# Patient Record
Sex: Male | Born: 1990 | Race: White | Hispanic: No | Marital: Single | Smoking: Current every day smoker
Health system: Southern US, Community
[De-identification: ages and names within clinical notes are randomized; demographics above are authoritative.]

## PROBLEM LIST (undated history)

## (undated) HISTORY — PX: ABDOMINAL SURGERY: SHX537

## (undated) HISTORY — PX: APPENDECTOMY: SHX54

---
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
## 2013-10-18 ENCOUNTER — Encounter (HOSPITAL_COMMUNITY): Payer: Self-pay | Admitting: Emergency Medicine

## 2013-10-18 ENCOUNTER — Emergency Department (HOSPITAL_COMMUNITY)
Admission: EM | Admit: 2013-10-18 | Discharge: 2013-10-18 | Disposition: A | Discharge status: Home / Self Care | Payer: Self-pay | Attending: Emergency Medicine | Admitting: Emergency Medicine

## 2013-10-18 ENCOUNTER — Emergency Department (HOSPITAL_COMMUNITY): Payer: Self-pay

## 2013-10-18 DIAGNOSIS — T8140XA Infection following a procedure, unspecified, initial encounter: Secondary | ICD-10-CM | POA: Insufficient documentation

## 2013-10-18 DIAGNOSIS — L089 Local infection of the skin and subcutaneous tissue, unspecified: Secondary | ICD-10-CM

## 2013-10-18 DIAGNOSIS — F17200 Nicotine dependence, unspecified, uncomplicated: Secondary | ICD-10-CM | POA: Insufficient documentation

## 2013-10-18 DIAGNOSIS — S6990XA Unspecified injury of unspecified wrist, hand and finger(s), initial encounter: Secondary | ICD-10-CM

## 2013-10-18 DIAGNOSIS — Y838 Other surgical procedures as the cause of abnormal reaction of the patient, or of later complication, without mention of misadventure at the time of the procedure: Secondary | ICD-10-CM | POA: Insufficient documentation

## 2013-10-18 DIAGNOSIS — S6980XA Other specified injuries of unspecified wrist, hand and finger(s), initial encounter: Secondary | ICD-10-CM

## 2013-10-18 DIAGNOSIS — IMO0002 Reserved for concepts with insufficient information to code with codable children: Secondary | ICD-10-CM

## 2013-10-18 LAB — CBC WITH DIFFERENTIAL/PLATELET
Basophils Absolute: 0 10*3/uL (ref 0.0–0.1)
Basophils Relative: 0 % (ref 0–1)
EOS PCT: 3 % (ref 0–5)
Eosinophils Absolute: 0.2 10*3/uL (ref 0.0–0.7)
HEMATOCRIT: 41.3 % (ref 39.0–52.0)
Hemoglobin: 14.2 g/dL (ref 13.0–17.0)
LYMPHS ABS: 2.7 10*3/uL (ref 0.7–4.0)
LYMPHS PCT: 32 % (ref 12–46)
MCH: 31.5 pg (ref 26.0–34.0)
MCHC: 34.4 g/dL (ref 30.0–36.0)
MCV: 91.6 fL (ref 78.0–100.0)
MONO ABS: 0.5 10*3/uL (ref 0.1–1.0)
Monocytes Relative: 6 % (ref 3–12)
Neutro Abs: 5 10*3/uL (ref 1.7–7.7)
Neutrophils Relative %: 59 % (ref 43–77)
Platelets: 297 10*3/uL (ref 150–400)
RBC: 4.51 MIL/uL (ref 4.22–5.81)
RDW: 12.8 % (ref 11.5–15.5)
WBC: 8.5 10*3/uL (ref 4.0–10.5)

## 2013-10-18 LAB — BASIC METABOLIC PANEL
BUN: 12 mg/dL (ref 6–23)
CO2: 24 mEq/L (ref 19–32)
CREATININE: 1.13 mg/dL (ref 0.50–1.35)
Calcium: 9.5 mg/dL (ref 8.4–10.5)
Chloride: 104 mEq/L (ref 96–112)
GFR calc Af Amer: >90 mL/min (ref >90)
GFR calc non Af Amer: >90 mL/min (ref >90)
Glucose, Bld: 95 mg/dL (ref 70–99)
Potassium: 3.9 mEq/L (ref 3.7–5.3)
Sodium: 142 mEq/L (ref 137–147)

## 2013-10-18 MED ORDER — OXYCODONE-ACETAMINOPHEN 5-325 MG PO TABS: 1.0000 | ORAL_TABLET | ORAL | Status: DC | PRN

## 2013-10-18 MED ORDER — HYDROMORPHONE HCL PF 1 MG/ML IJ SOLN
INTRAMUSCULAR | Status: AC
  Administered 2013-10-18: 1 mg via INTRAVENOUS
  Filled 2013-10-18: qty 1

## 2013-10-18 MED ORDER — LIDOCAINE HCL (PF) 1 % IJ SOLN
INTRAMUSCULAR | Status: AC
  Administered 2013-10-18: 13:00:00
  Filled 2013-10-18: qty 5

## 2013-10-18 MED ORDER — SODIUM CHLORIDE 0.9 % IV SOLN
3.0000 g | Freq: Once | INTRAVENOUS | Status: AC
  Administered 2013-10-18: 3 g via INTRAVENOUS
  Filled 2013-10-18: qty 3

## 2013-10-18 MED ORDER — IBUPROFEN 800 MG PO TABS
800.0000 mg | ORAL_TABLET | Freq: Once | ORAL | Status: AC
  Administered 2013-10-18: 800 mg via ORAL
  Filled 2013-10-18: qty 1

## 2013-10-18 MED ORDER — AMOXICILLIN-POT CLAVULANATE 875-125 MG PO TABS: 1.0000 | ORAL_TABLET | Freq: Two times a day (BID) | ORAL | Status: DC

## 2013-10-18 MED ORDER — MORPHINE SULFATE 4 MG/ML IJ SOLN
INTRAMUSCULAR | Status: AC
  Filled 2013-10-18: qty 1

## 2013-10-18 MED ORDER — OXYCODONE-ACETAMINOPHEN 5-325 MG PO TABS: 2.0000 | ORAL_TABLET | ORAL | Status: DC | PRN

## 2013-10-18 MED ORDER — MORPHINE SULFATE 4 MG/ML IJ SOLN
4.0000 mg | Freq: Once | INTRAMUSCULAR | Status: AC
  Administered 2013-10-18: 4 mg via INTRAVENOUS
  Filled 2013-10-18: qty 1

## 2013-10-18 MED ORDER — OXYCODONE-ACETAMINOPHEN 5-325 MG PO TABS
1.0000 | ORAL_TABLET | Freq: Once | ORAL | Status: AC
  Administered 2013-10-18: 1 via ORAL
  Filled 2013-10-18: qty 1

## 2013-10-18 MED ORDER — HYDROMORPHONE HCL PF 1 MG/ML IJ SOLN
1.0000 mg | Freq: Once | INTRAMUSCULAR | Status: AC
  Administered 2013-10-18: 1 mg via INTRAVENOUS

## 2013-10-18 MED ORDER — MORPHINE SULFATE 4 MG/ML IJ SOLN
4.0000 mg | Freq: Once | INTRAMUSCULAR | Status: AC
  Administered 2013-10-18: 4 mg via INTRAVENOUS

## 2013-10-18 MED ORDER — OXYCODONE-ACETAMINOPHEN 5-325 MG PO TABS
1.0000 | ORAL_TABLET | ORAL | Status: AC
  Administered 2013-10-18: 1 via ORAL
  Filled 2013-10-18: qty 1

## 2013-10-18 NOTE — ED Notes (Signed)
Pt reports he injured his left third digit on Saturday, Monday [facility] at which point pt's find was drained d/t hematoma forming under the nailbed - pt presents today w/ erythremia and swelling to the third digit, progressively worse noted yesterday a.m. - pt denies any n/v/d, fever or chills.

## 2013-10-18 NOTE — ED Notes (Signed)
Pt advised Dr Romeo AppleHarrison will be coming to drain finger but will have to see some office patients first. Nad. Pt resting well.

## 2013-10-18 NOTE — ED Notes (Signed)
Pt states pain is going down. More relaxed

## 2013-10-18 NOTE — ED Provider Notes (Addendum)
CSN: [id number]     Arrival date & time 10/18/13  0219 History   First MD Initiated Contact with Patient 10/18/13 0501     Chief Complaint  Patient presents with  . Finger Injury   (Consider location/radiation/quality/duration/timing/severity/associated sxs/prior Treatment) HPI Patient with questionable injury to the third digit of his left hand on Saturday. Have blood accumulating under the nail. Was seen in the day in the emergency department and had trephination. Yesterday but patient began to notice increased swelling and redness to the distal tip of the same finger. He's had increasing pain. He's had no fevers or chills. He's had no nausea vomiting. He has mildly limited range of motion due to pain. No spontaneous drainage History reviewed. No pertinent past medical history. History reviewed. No pertinent past surgical history. No family history on file. History  Substance Use Topics  . Smoking status: Current Every Day Smoker    Types: Cigarettes  . Smokeless tobacco: Not on file  . Alcohol Use: No    Review of Systems  Constitutional: Negative for fever and chills.  Gastrointestinal: Negative for nausea, vomiting and abdominal pain.  Skin: Positive for color change.  Neurological: Negative for weakness and numbness.  All other systems reviewed and are negative.    Allergies  Review of patient's allergies indicates no known allergies.  Home Medications  No current outpatient prescriptions on file. BP 151/100  Pulse 77  Temp(Src) 97.5 F (36.4 C) (Oral)  Resp 20  Ht 6\' 1"  (1.854 m)  Wt 150 lb (68.04 kg)  BMI 19.79 kg/m2  SpO2 100% Physical Exam  Nursing note and vitals reviewed. Constitutional: He is oriented to person, place, and time. He appears well-developed and well-nourished. He appears distressed (patient is uncomfortable).  HENT:  Head: Normocephalic and atraumatic.  Mouth/Throat: Oropharynx is clear and moist.  Eyes: Pupils are equal, round, and reactive  to light.  Neck: Normal range of motion. Neck supple.  Cardiovascular: Normal rate and regular rhythm.   Pulmonary/Chest: Effort normal.  Abdominal: Soft.  Musculoskeletal: Normal range of motion. He exhibits no edema and no tenderness.  Diffusely swollen third digit of the left hand. From the PIP distally the digit is warm to touch and erythematous. Patient also has significant amount of pain throughout. He is unable to fully flex the finger. He does not have any tenderness along the palmar surface extending down into the hand along the flexor tendons. There is some dried blood beneath the nail of the third digit. No definite drainable abscess is present  Neurological: He is alert and oriented to person, place, and time.  Sensation intact. Limited mobility of the left third digit due to pain and swelling  Skin: Skin is warm and dry. No rash noted. There is erythema.  Psychiatric: He has a normal mood and affect. His behavior is normal.    ED Course  Procedures (including critical care time) Labs Review Labs Reviewed  CBC WITH DIFFERENTIAL  BASIC METABOLIC PANEL   Imaging Review Dg Hand 2 View Left  10/18/2013   CLINICAL DATA:  Finger injury.  EXAM: LEFT HAND - 2 VIEW  COMPARISON:  None.  FINDINGS: There is soft tissue swelling of the long finger pad. No underlying fracture or visible infection. No radiodense foreign body.  IMPRESSION: No osseous abnormality. Middle finger soft tissue swelling distally.   Electronically Signed   By: Tiburcio PeaJonathan  Watts M.D.   On: 10/18/2013 06:18    EKG Interpretation   None  MDM  Post trephination infection. No acute bony findings on x-ray. Patient was given IV antibiotics and we will discuss followup with hand surgery.  Discussed with Dr. Janee Morn who is hand surgery on call. Stated the patient needed oral antibiotics but did not need to be seen by a hand surgeon because he did not have a surgical problem at this point. The patient has received IV  antibiotics is feeling much better. Will start on oral antibiotics and have the patient followup in the emergency department in 1 day or sooner if symptoms worsen this patient does not have a primary MD  Loren Racer, MD 10/18/13 1308  Loren Racer, MD 10/18/13 [phone]  On reevaluation the patient now has visible purulence under the nail and at the insertion site of the nail into the nail bed. States he is still having pain at the site.  Loren Racer, MD 10/19/13 [phone]

## 2013-10-18 NOTE — Discharge Instructions (Signed)
Follow up per dr. Mort SawyersHarrison's instructions

## 2013-10-18 NOTE — ED Notes (Signed)
Pt mashes finger on sat was seen in [place] & pressure released. Pt now red & swollen

## 2013-10-18 NOTE — Consult Note (Signed)
Reason for Consult: Infection left long finger Referring Physician: Dr. Estell Harpin M.D.  Glenn Green is an 23 y.o. male.  HPI: 23 year-old male injured his finger chopping wood with a blunt trauma no penetrating injury was noted. He injured himself about 5 days ago. 2 days after injury went to the emergency room again for regional and evacuation of the subungual hematoma with a hot pin device area 2 days later he developed pain swelling redness and came to the emergency room. There was discussion about sending the patient to [facility] for hand evaluation. They felt that the patient could be evaluated in the [place].  The patient came to the emergency room early in the morning approximately 4 AM. I was unaware that he had been in the emergency room since 4:00 in the morning.  History reviewed. No pertinent past medical history.  History reviewed. No pertinent past surgical history.  No family history on file.  Social History:  reports that he has been smoking Cigarettes.  He has been smoking about 0.00 packs per day. He does not have any smokeless tobacco history on file. He reports that he does not drink alcohol or use illicit drugs.  Allergies: No Known Allergies  Medications: I have reviewed the patient's current medications.  Results for orders placed during the hospital encounter of 10/18/13 (from the past 48 hour(s))  CBC WITH DIFFERENTIAL     Status: None   Collection Time    10/18/13  5:16 AM      Result Value Range   WBC 8.5  4.0 - 10.5 K/uL   RBC 4.51  4.22 - 5.81 MIL/uL   Hemoglobin 14.2  13.0 - 17.0 g/dL   HCT 11.9  41.7 - 40.8 %   MCV 91.6  78.0 - 100.0 fL   MCH 31.5  26.0 - 34.0 pg   MCHC 34.4  30.0 - 36.0 g/dL   RDW 14.4  81.8 - 56.3 %   Platelets 297  150 - 400 K/uL   Neutrophils Relative % 59  43 - 77 %   Neutro Abs 5.0  1.7 - 7.7 K/uL   Lymphocytes Relative 32  12 - 46 %   Lymphs Abs 2.7  0.7 - 4.0 K/uL   Monocytes Relative 6  3 - 12 %   Monocytes  Absolute 0.5  0.1 - 1.0 K/uL   Eosinophils Relative 3  0 - 5 %   Eosinophils Absolute 0.2  0.0 - 0.7 K/uL   Basophils Relative 0  0 - 1 %   Basophils Absolute 0.0  0.0 - 0.1 K/uL  BASIC METABOLIC PANEL     Status: None   Collection Time    10/18/13  5:16 AM      Result Value Range   Sodium 142  137 - 147 mEq/L   Potassium 3.9  3.7 - 5.3 mEq/L   Chloride 104  96 - 112 mEq/L   CO2 24  19 - 32 mEq/L   Glucose, Bld 95  70 - 99 mg/dL   BUN 12  6 - 23 mg/dL   Creatinine, Ser 1.49  0.50 - 1.35 mg/dL   Calcium 9.5  8.4 - 70.2 mg/dL   GFR calc non Af Amer >90  >90 mL/min   GFR calc Af Amer >90  >90 mL/min   Comment: (NOTE)     The eGFR has been calculated using the CKD EPI equation.     This calculation has not been validated in all  clinical situations.     eGFR's persistently <90 mL/min signify possible Chronic Kidney     Disease.    Dg Hand 2 View Left  10/18/2013   CLINICAL DATA:  Finger injury.  EXAM: LEFT HAND - 2 VIEW  COMPARISON:  None.  FINDINGS: There is soft tissue swelling of the long finger pad. No underlying fracture or visible infection. No radiodense foreign body.  IMPRESSION: No osseous abnormality. Middle finger soft tissue swelling distally.   Electronically Signed   By: Jorje Guild M.D.   On: 10/18/2013 06:18    ROS normal review of systems Blood pressure 118/84, pulse 63, temperature 97.5 F (36.4 C), temperature source Oral, resp. rate 17, height $RemoveBe'6\' 1"'qAIJjTTFD$  (1.854 m), weight 150 lb (68.04 kg), SpO2 97.00%. Physical Exam   Vital signs: BP 149/91  Pulse 88  Temp(Src) 97.5 F (36.4 C) (Oral)  Resp 20  Ht $R'6\' 1"'Ww$  (1.854 m)  Wt 150 lb (68.04 kg)  BMI 19.79 kg/m2  SpO2 100% Overall the patient's appearance is normal is oriented x3 his mood is pleasant has no gait abnormalities. He has normal pulses and perfusion in his upper extremity with good color and the involved digit normal sensation there is no lymphadenopathy or lymphangitis. There are no pathologic reflexes. He  has a subungual hematoma in the proximal half of the nail of the left long finger with erythema tracking to the area of the PIP joint and then along the radial or thumb side of the digit with tenderness. There is no major tenderness although there is mild redness in the pulp area. The DIP joint has normal motion there is no instability there is normal flexion extension power   Imaging the x-ray does not show osteomyelitis or fracture  Assessment: It appears to me that the evacuation of the hematoma lead to bacterial infection under the nail and then up to the level of the PIP joint and along the radial side of the digit.    Plan: Incision and drainage  Procedure verbal consent and timeout were performed in the ER. 1% lidocaine block was performed as a digital block after sterile prep and drape the nail was removed and 2 incisions were made on each side of the finger the skin flap was elevated to the level of the extensor tendon and a wick was placed after irrigation and debridement  Sterile dressing was applied  Followup in a week and start antibiotics  Meds ordered this encounter  Medications  . oxyCODONE-acetaminophen (PERCOCET/ROXICET) 5-325 MG per tablet 1 tablet    Sig:   . Ampicillin-Sulbactam (UNASYN) 3 g in sodium chloride 0.9 % 100 mL IVPB    Sig:   . morphine 4 MG/ML injection 4 mg    Sig:   . DISCONTD: morphine 4 MG/ML injection    Sig:     Koechert, Kimberly  : cabinet override  . DISCONTD: amoxicillin-clavulanate (AUGMENTIN) 875-125 MG per tablet    Sig: Take 1 tablet by mouth 2 (two) times daily. One po bid x 7 days    Dispense:  14 tablet    Refill:  0  . DISCONTD: oxyCODONE-acetaminophen (PERCOCET) 5-325 MG per tablet    Sig: Take 2 tablets by mouth every 4 (four) hours as needed.    Dispense:  20 tablet    Refill:  0  . morphine 4 MG/ML injection 4 mg    Sig:   . lidocaine (PF) (XYLOCAINE) 1 % injection    Sig:  [place], Christy   : cabinet override  .  DISCONTD: oxyCODONE-acetaminophen (PERCOCET) 5-325 MG per tablet    Sig: Take 1 tablet by mouth every 4 (four) hours as needed.    Dispense:  42 tablet    Refill:  0  . oxyCODONE-acetaminophen (PERCOCET) 5-325 MG per tablet    Sig: Take 1 tablet by mouth every 4 (four) hours as needed.    Dispense:  42 tablet    Refill:  0  . amoxicillin-clavulanate (AUGMENTIN) 875-125 MG per tablet    Sig: Take 1 tablet by mouth 2 (two) times daily. One po bid x 7 days    Dispense:  14 tablet    Refill:  0  . HYDROmorphone (DILAUDID) 1 MG/ML injection    Sig:     [place], Christy   : cabinet override  . oxyCODONE-acetaminophen (PERCOCET/ROXICET) 5-325 MG per tablet 1 tablet    Sig:   . HYDROmorphone (DILAUDID) injection 1 mg    Sig:   . ibuprofen (ADVIL,MOTRIN) tablet 800 mg    Sig:      Assessment/Plan: Dressing with lidocaine digital block in office 14 th of Jan   Cline Draheim 10/18/2013, 1:19 PM

## 2013-10-18 NOTE — ED Notes (Signed)
Pt crying in pain. Dr Romeo Appleharrison aware and vo received. Read back and verified.

## 2013-10-25 ENCOUNTER — Ambulatory Visit (INDEPENDENT_AMBULATORY_CARE_PROVIDER_SITE_OTHER): Payer: Self-pay | Admitting: Orthopedic Surgery

## 2013-10-25 VITALS — BP 110/73 | Ht 73.0 in | Wt 167.0 lb

## 2013-10-25 DIAGNOSIS — S6980XA Other specified injuries of unspecified wrist, hand and finger(s), initial encounter: Secondary | ICD-10-CM

## 2013-10-25 DIAGNOSIS — S6990XA Unspecified injury of unspecified wrist, hand and finger(s), initial encounter: Secondary | ICD-10-CM

## 2013-10-25 DIAGNOSIS — IMO0002 Reserved for concepts with insufficient information to code with codable children: Secondary | ICD-10-CM | POA: Insufficient documentation

## 2013-10-25 MED ORDER — OXYCODONE-ACETAMINOPHEN 5-325 MG PO TABS: 1.0000 | ORAL_TABLET | ORAL | Status: DC | PRN

## 2013-10-25 MED ORDER — AMOXICILLIN-POT CLAVULANATE 875-125 MG PO TABS: 1.0000 | ORAL_TABLET | Freq: Two times a day (BID) | ORAL | Status: DC

## 2013-10-25 NOTE — Progress Notes (Signed)
Patient ID: Glenn Green, male   DOB: March 28, 1991, 23 y.o.   MRN: [id number] Chief Complaint  Patient presents with  . Follow-up    Left Middle finger dressing with lidocaine digital block DOI 10/07/13    Glenn Green is an 23 y.o. male.   HPI: 23 year-old male injured his finger chopping wood with a blunt trauma no penetrating injury was noted. He injured himself about 5 days ago. 2 days after injury went to the emergency room again for regional and evacuation of the subungual hematoma with a hot pin device area 2 days later he developed pain swelling redness and came to the emergency room. There was discussion about sending the [facility]. They felt that the patient could be evaluated in the [place].  The patient came to the emergency room early in the morning approximately 4 AM. I was unaware that he had been in the emergency room since 4:00 in the morning. History  Digital block dressing changed wound looks good  Start wound care continue antibiotics refill Percocet return for dressing change on the 29th. Dressing changes will begin at the hospital on local wound care

## 2013-10-25 NOTE — Patient Instructions (Signed)
School note for rider

## 2013-10-27 ENCOUNTER — Other Ambulatory Visit: Payer: Self-pay | Admitting: *Deleted

## 2013-10-27 DIAGNOSIS — S6990XA Unspecified injury of unspecified wrist, hand and finger(s), initial encounter: Secondary | ICD-10-CM

## 2013-11-02 ENCOUNTER — Telehealth: Payer: Self-pay | Admitting: Orthopedic Surgery

## 2013-11-02 ENCOUNTER — Ambulatory Visit (HOSPITAL_COMMUNITY)
Admission: RE | Admit: 2013-11-02 | Discharge: 2013-11-02 | Disposition: A | Discharge status: Home / Self Care | Payer: Self-pay | Source: Ambulatory Visit | Attending: Orthopedic Surgery | Admitting: Orthopedic Surgery

## 2013-11-02 ENCOUNTER — Other Ambulatory Visit: Payer: Self-pay | Admitting: *Deleted

## 2013-11-02 DIAGNOSIS — IMO0001 Reserved for inherently not codable concepts without codable children: Secondary | ICD-10-CM | POA: Insufficient documentation

## 2013-11-02 DIAGNOSIS — T148XXA Other injury of unspecified body region, initial encounter: Secondary | ICD-10-CM

## 2013-11-02 DIAGNOSIS — L089 Local infection of the skin and subcutaneous tissue, unspecified: Secondary | ICD-10-CM

## 2013-11-02 DIAGNOSIS — S61209A Unspecified open wound of unspecified finger without damage to nail, initial encounter: Secondary | ICD-10-CM | POA: Insufficient documentation

## 2013-11-02 MED ORDER — OXYCODONE-ACETAMINOPHEN 5-325 MG PO TABS: 1.0000 | ORAL_TABLET | ORAL | Status: DC | PRN

## 2013-11-02 NOTE — Telephone Encounter (Signed)
Glenn Green wants another Percocet 5/325 prescription.  Also said he finished the antibiotics last night and asked if you want him to continue .  His # [phone]

## 2013-11-02 NOTE — Telephone Encounter (Signed)
Routing to Dr Harrison 

## 2013-11-02 NOTE — Telephone Encounter (Signed)
Patient advised pain prescription was ready to be picked up, and Dr. Romeo AppleHarrison did not want to refill antibiotic.

## 2013-11-02 NOTE — Telephone Encounter (Signed)
Yes

## 2013-11-02 NOTE — Progress Notes (Signed)
Physical Therapy - Wound Therapy  Evaluation   Patient Details  Name: Glenn Green MRN: [id number] Date of Birth: 11-16-1990  Today's Date: 11/02/2013 Time:  -   charges- PT evaluation  [phone]  Visit#:1    of    Re-eval:    Subjective Subjective Assessment Subjective: HPI: 23 year-old male injured his finger chopping wood with a blunt trauma no penetrating injury was noted. He injured himself 10/18/13 chopping wood , 2 days after injury went to the emergency room again for regional and evacuation of the subungual hematoma with a hot pin device area 2 days later he developed pain swelling redness and came to the emergency room. There was discussion about [facility].   Patient and Family Stated Goals: return to work  Date of Onset: 10/18/13 Prior Treatments: nail removed and treatment at ER   Pain Assessment Pain Assessment Pain Assessment: 0-10 Pain Score: 5  Pain Type: Acute pain Pain Location: Finger  Middle  Pain Orientation: Left  Wound Therapy Wound 11/02/13 (Active)  Site / Wound Assessment Painful 11/02/2013 11:52 AM  % Wound base Red or Granulating 50% 11/02/2013 11:52 AM  % Wound base Other (Comment) 50% 11/02/2013 11:52 AM  Peri-wound Assessment Intact 11/02/2013 11:52 AM  Wound Length (cm) 1.5 cm 11/02/2013 11:52 AM  Wound Depth (cm) 0 cm 11/02/2013 11:52 AM  Undermining (cm) underneath  cuticle  11/02/2013 11:52 AM  Margins Attached edges (approximated) 11/02/2013 11:52 AM  Closure None 11/02/2013 11:52 AM  Drainage Amount Scant 11/02/2013 11:52 AM  Drainage Description Serous 11/02/2013 11:52 AM  Treatment Cleansed;Debridement (Selective) 11/02/2013 11:52 AM  Dressing Type Impregnated gauze (bismuth) 11/02/2013 11:52 AM  Dressing Changed Changed 11/02/2013 11:52 AM  Dressing Status Clean 11/02/2013 11:52 AM   Selective Debridement Selective Debridement - Location: left middle /third finger   nail bed    Physical Therapy  Assessment and Plan Wound Therapy - Assess/Plan/Recommendations good rehab potential   Wound Therapy - Clinical Statement: 23 year old male referred for wound care for left 3rd digit, dressing changed today and site debridded. Will continue wound care and education   Plan: Patient/family education;Debridement;Dressing change Wound Therapy - Frequency: 3X / week for 4 weeks       Goals Wound Therapy Goals - Improve the function of patient's integumentary system by progressing the wound(s) through the phases of wound healing by: Decrease Necrotic Tissue to: none  Decrease Necrotic Tissue - Progress: Goal set today Increase Granulation Tissue to: 75% or greater  Patient/Family will be able to : understand signs of infection  Patient/Family Instruction Goal - Progress: Goal set today Additional Wound Therapy Goal: return to work upon dr release  Additional Wound Therapy Goal - Progress: Goal set today Goals/treatment plan/discharge plan were made with and agreed upon by patient/family: Yes  Problem List Patient Active Problem List   Diagnosis Date Noted  . Wound infection 11/02/2013  . Finger injury 10/25/2013  . Felon 10/25/2013  . Paronychia 10/25/2013    GP    Ladona Rosten 11/02/2013, 1:03 PM

## 2013-11-03 NOTE — Telephone Encounter (Signed)
Prescription picked up by the patient °

## 2013-11-06 ENCOUNTER — Ambulatory Visit (HOSPITAL_COMMUNITY)
Admission: RE | Admit: 2013-11-06 | Discharge: 2013-11-06 | Disposition: A | Discharge status: Home / Self Care | Payer: Self-pay | Source: Ambulatory Visit | Attending: Orthopedic Surgery | Admitting: Orthopedic Surgery

## 2013-11-06 NOTE — Progress Notes (Signed)
Physical Therapy - Wound Therapy  Treatment   Patient Details  Name: Glenn Green MRN: [id number] Date of Birth: 01/31/1991  Today's Date: 11/06/2013 Time: [id number] Time Calculation (min): 25 min Charges: Selective debridement (= or < 20 cm)   Visit#: 2 of 8  Re-eval: 11/30/13  Subjective Subjective Assessment Subjective: Pt states that dressing slid off and he redressed it.  Pain Assessment Pain Assessment Pain Score: 5  (After debridement) Pain Location: Finger (Comment which one) (Middle) Pain Orientation: Left  Wound Therapy Wound 11/02/13 (Active)  Site / Wound Assessment Painful 11/06/2013  5:47 PM  % Wound base Red or Granulating 50% 11/06/2013  5:47 PM  % Wound base Other (Comment) 50% 11/06/2013  5:47 PM  Peri-wound Assessment Intact 11/06/2013  5:47 PM  Wound Length (cm) 1.5 cm 11/02/2013 11:52 AM  Wound Depth (cm) 0 cm 11/02/2013 11:52 AM  Undermining (cm) underneath  cuticle  11/02/2013 11:52 AM  Margins Attached edges (approximated) 11/06/2013  5:47 PM  Closure None 11/06/2013  5:47 PM  Drainage Amount Scant 11/06/2013  5:47 PM  Drainage Description Serous 11/06/2013  5:47 PM  Treatment Cleansed;Debridement (Selective) 11/06/2013  5:47 PM  Dressing Type Impregnated gauze (bismuth) 11/06/2013  5:47 PM  Dressing Changed Changed 11/06/2013  5:47 PM  Dressing Status Clean 11/06/2013  5:47 PM   Selective Debridement Selective Debridement - Location: left middle /third finger nail bed  Selective Debridement - Tools Used: Forceps;Scalpel Selective Debridement - Tissue Removed: dead skin/ dried blood   Physical Therapy Assessment and Plan Wound Therapy - Assess/Plan/Recommendations Wound Therapy - Clinical Statement: Wound appears healthier. Pt tolerates debridement well. Continued with xeroform dressing to improve wound moisture. Vaseline applied to periwound to protect skin integrity. Pt tolerates debridement well. Wound Plan: Conitnue wound care per PT  POC.  Problem List Patient Active Problem List   Diagnosis Date Noted  . Wound infection 11/02/2013  . Finger injury 10/25/2013  . Felon 10/25/2013  . Paronychia 10/25/2013    Seth Bakeebekah Acen Craun, PTA  11/06/2013, 5:53 PM

## 2013-11-09 ENCOUNTER — Ambulatory Visit (HOSPITAL_COMMUNITY)
Admission: RE | Admit: 2013-11-09 | Discharge: 2013-11-09 | Disposition: A | Discharge status: Home / Self Care | Payer: Self-pay | Source: Ambulatory Visit | Attending: Orthopedic Surgery | Admitting: Orthopedic Surgery

## 2013-11-09 ENCOUNTER — Other Ambulatory Visit: Payer: Self-pay | Admitting: *Deleted

## 2013-11-09 ENCOUNTER — Ambulatory Visit (INDEPENDENT_AMBULATORY_CARE_PROVIDER_SITE_OTHER): Payer: Self-pay | Admitting: Orthopedic Surgery

## 2013-11-09 VITALS — BP 104/71 | Ht 73.0 in | Wt 167.0 lb

## 2013-11-09 DIAGNOSIS — S6990XA Unspecified injury of unspecified wrist, hand and finger(s), initial encounter: Secondary | ICD-10-CM

## 2013-11-09 DIAGNOSIS — IMO0002 Reserved for concepts with insufficient information to code with codable children: Secondary | ICD-10-CM

## 2013-11-09 DIAGNOSIS — S6980XA Other specified injuries of unspecified wrist, hand and finger(s), initial encounter: Secondary | ICD-10-CM

## 2013-11-09 MED ORDER — HYDROCODONE-ACETAMINOPHEN 7.5-325 MG PO TABS: 1.0000 | ORAL_TABLET | Freq: Four times a day (QID) | ORAL | Status: DC | PRN

## 2013-11-09 NOTE — Progress Notes (Signed)
Patient ID: Glenn Green, male   DOB: 22-Jan-1991, 23 y.o.   MRN: [id number] Status post incision drainage felon and paronychia doing well dressing changes in the hospital continued for 2 weeks redressed followup 2 weeks after that we can start Band-Aids FOR dressings

## 2013-11-09 NOTE — Progress Notes (Signed)
Physical Therapy - Wound Therapy  Treatment   Patient Details  Name: Glenn Green MRN: [id number] Date of Birth: [birth year]  Today's Date: 11/09/2013 Time: [id number] Time Calculation (min): 30 min Charges: Selective debridement (= or < 20 cm)   Visit#: 3 of 8  Re-eval: 11/30/13  Subjective Subjective Assessment Subjective: Pt states that dressing stayed on well.  Pain Assessment Pain Assessment Pain Assessment: No/denies pain  Wound Therapy Wound 11/02/13 (Active)  Site / Wound Assessment Painful 11/09/2013  5:01 PM  % Wound base Red or Granulating 80% 11/09/2013  5:01 PM  % Wound base Other (Comment) 20% 11/09/2013  5:01 PM  Peri-wound Assessment Intact 11/09/2013  5:01 PM  Wound Length (cm) 1.5 cm 11/02/2013 11:52 AM  Wound Depth (cm) 0 cm 11/02/2013 11:52 AM  Undermining (cm) underneath  cuticle  11/02/2013 11:52 AM  Margins Attached edges (approximated) 11/09/2013  5:01 PM  Closure None 11/09/2013  5:01 PM  Drainage Amount Scant 11/09/2013  5:01 PM  Drainage Description Serous 11/09/2013  5:01 PM  Treatment Cleansed;Debridement (Selective) 11/09/2013  5:01 PM  Dressing Type Impregnated gauze (bismuth) 11/09/2013  5:01 PM  Dressing Changed Changed 11/09/2013  5:01 PM  Dressing Status Clean 11/09/2013  5:01 PM   Selective Debridement Selective Debridement - Location: left middle /third finger nail bed  Selective Debridement - Tools Used: Forceps;Scalpel Selective Debridement - Tissue Removed: dead skin/ dried blood   Physical Therapy Assessment and Plan Wound Therapy - Assess/Plan/Recommendations Wound Therapy - Clinical Statement: Wound continues to progress well. Able to remove significant amount of dead tissue/dried blood. Continues with xeroform to maintain moisture. Vaseline applied to periwound to protect skin integrity Pt tolerates debridement well. Wound Plan: Conitnue wound care per PT POC.  Problem List Patient Active Problem List   Diagnosis Date Noted  .  Wound infection 11/02/2013  . Finger injury 10/25/2013  . Felon 10/25/2013  . Paronychia 10/25/2013    Seth Bakeebekah Izan Miron, PTA  11/09/2013, 5:08 PM

## 2013-11-09 NOTE — Patient Instructions (Signed)
Continue to go for dressing changes at hospital

## 2013-11-13 ENCOUNTER — Ambulatory Visit (HOSPITAL_COMMUNITY)
Admission: RE | Admit: 2013-11-13 | Discharge: 2013-11-13 | Disposition: A | Discharge status: Home / Self Care | Payer: Self-pay | Source: Ambulatory Visit | Attending: Orthopedic Surgery | Admitting: Orthopedic Surgery

## 2013-11-13 DIAGNOSIS — T148XXA Other injury of unspecified body region, initial encounter: Secondary | ICD-10-CM

## 2013-11-13 DIAGNOSIS — IMO0001 Reserved for inherently not codable concepts without codable children: Secondary | ICD-10-CM | POA: Insufficient documentation

## 2013-11-13 DIAGNOSIS — L089 Local infection of the skin and subcutaneous tissue, unspecified: Secondary | ICD-10-CM

## 2013-11-13 DIAGNOSIS — S61209A Unspecified open wound of unspecified finger without damage to nail, initial encounter: Secondary | ICD-10-CM | POA: Insufficient documentation

## 2013-11-13 NOTE — Progress Notes (Signed)
Physical Therapy - Wound Therapy/ Discharge note  Treatment   Patient Details  Name: Glenn Green MRN: [id number] Date of Birth: 1991/03/28  Today's Date: 11/13/2013 Time: 9373-4287 Time Calculation (min): 12 min Charge: self care 12 min   Visit#: 4 of 8  Re-eval: 11/30/13  Subjective Subjective Assessment Subjective: Pt stated middle finger pain scale 4/10 today.  Pain Assessment Pain Assessment Pain Score: 4  Pain Location: Finger (Comment which one) (Middle) Pain Orientation: Left  Wound Therapy  11/13/13 1500  Wound  Date First Assessed: 11/02/13    Site / Wound Assessment Granulation tissue  % Wound base Red or Granulating 100%  % Wound base Yellow 0%  Peri-wound Assessment Intact  Margins Attached edges (approximated)  Closure None  Drainage Amount None  Treatment Cleansed  Dressing Type Impregnated gauze (bismuth) (bandaid with #1 netting)  Dressing Status Clean  Selective Debridement  Selective Debridement - Location no debridment necessary this session, just education on dressings for home care  Wound Therapy - Assess/Plan/Recommendations  Wound Therapy - Clinical Statement No debridement necessary this session.  Following discussion with pt and girlfriend decision made to [facility] to self care.  Instructured proper care techniques and girlfriend feels confident completeing cleansing and dressings.  Wound Plan D/C to self care per no selective debridement necessary    Selective Debridement Selective Debridement - Location: no debridment necessary this session, just education on dressings for home care   Physical Therapy Assessment and Plan Wound Therapy - Assess/Plan/Recommendations Wound Therapy - Clinical Statement: No debridement necessary this session.  Following discussion with pt and girlfriend decision made to [facility] to self care.  Instructured proper care techniques and girlfriend feels confident completeing cleansing and dressings. Wound Plan: D/C  to self care per no selective debridement necessary      Goals Wound Therapy Goals - Improve the function of patient's integumentary system by progressing the wound(s) through the phases of wound healing by: Decrease Necrotic Tissue to: none  Decrease Necrotic Tissue - Progress: Met Increase Granulation Tissue to: 75% or greater  Increase Granulation Tissue - Progress: Met Patient/Family will be able to : understand signs of infection  Patient/Family Instruction Goal - Progress: Met Additional Wound Therapy Goal: return to work upon dr release  Additional Wound Therapy Goal - Progress: Progressing toward goal Goals/treatment plan/discharge plan were made with and agreed upon by patient/family: Yes  Problem List Patient Active Problem List   Diagnosis Date Noted  . Wound infection 11/02/2013  . Finger injury 10/25/2013  . Felon 10/25/2013  . Paronychia 10/25/2013    GP    Aldona Lento 11/13/2013, 3:58 PM

## 2013-11-16 ENCOUNTER — Ambulatory Visit (HOSPITAL_COMMUNITY): Payer: Self-pay

## 2013-11-20 ENCOUNTER — Ambulatory Visit (HOSPITAL_COMMUNITY): Payer: Self-pay

## 2013-11-21 ENCOUNTER — Ambulatory Visit (INDEPENDENT_AMBULATORY_CARE_PROVIDER_SITE_OTHER): Payer: Self-pay | Admitting: Orthopedic Surgery

## 2013-11-21 VITALS — BP 121/78 | Ht 73.0 in | Wt 167.0 lb

## 2013-11-21 DIAGNOSIS — S6980XA Other specified injuries of unspecified wrist, hand and finger(s), initial encounter: Secondary | ICD-10-CM

## 2013-11-21 DIAGNOSIS — S6990XA Unspecified injury of unspecified wrist, hand and finger(s), initial encounter: Secondary | ICD-10-CM

## 2013-11-21 MED ORDER — HYDROCODONE-ACETAMINOPHEN 7.5-325 MG PO TABS: 1.0000 | ORAL_TABLET | Freq: Four times a day (QID) | ORAL | Status: DC | PRN

## 2013-11-21 NOTE — Progress Notes (Signed)
Patient ID: Glenn Green, male   DOB: 09/27/1991, 23 y.o.   MRN: [id number] Status post left long finger drainage in the ER for fell and  Wounds look clean nail bed starting to heal  Continue dressing changes and wound care followup in end of March for reevaluation of the nail

## 2013-11-21 NOTE — Patient Instructions (Signed)
Soak 10 minutes 3 times a day, dry and cover

## 2013-11-23 ENCOUNTER — Ambulatory Visit (HOSPITAL_COMMUNITY): Payer: Self-pay | Admitting: *Deleted

## 2014-01-02 ENCOUNTER — Ambulatory Visit (INDEPENDENT_AMBULATORY_CARE_PROVIDER_SITE_OTHER): Payer: Self-pay | Admitting: Orthopedic Surgery

## 2014-01-02 VITALS — BP 127/74 | Ht 73.0 in | Wt 167.0 lb

## 2014-01-02 DIAGNOSIS — S6990XA Unspecified injury of unspecified wrist, hand and finger(s), initial encounter: Secondary | ICD-10-CM

## 2014-01-02 DIAGNOSIS — S6980XA Other specified injuries of unspecified wrist, hand and finger(s), initial encounter: Secondary | ICD-10-CM

## 2014-01-02 DIAGNOSIS — IMO0002 Reserved for concepts with insufficient information to code with codable children: Secondary | ICD-10-CM

## 2014-01-02 MED ORDER — HYDROCODONE-ACETAMINOPHEN 5-325 MG PO TABS: 1.0000 | ORAL_TABLET | Freq: Four times a day (QID) | ORAL | Status: DC | PRN

## 2014-01-02 NOTE — Patient Instructions (Addendum)
activities as tolerated  Nail will grow in,   you will not have to come back

## 2014-01-02 NOTE — Progress Notes (Signed)
Patient ID: Glenn Green, male   DOB: 08/07/1991, 23 y.o.   MRN: [id number]  Chief Complaint  Patient presents with  . Follow-up    6 week recheck left long finger DOI 10/07/13    Encounter Diagnoses  Name Primary?  . Paronychia Yes  . Felon   . Finger injury     BP 127/74  Ht 6\' 1"  (1.854 m)  Wt 167 lb (75.751 kg)  BMI 22.04 kg/m2  There are no signs of infection he's regained full range of motion his nail is growing in  Last prescription follow up as needed.

## 2014-10-24 ENCOUNTER — Encounter (HOSPITAL_COMMUNITY): Payer: Self-pay | Admitting: Emergency Medicine

## 2014-10-24 ENCOUNTER — Emergency Department (HOSPITAL_COMMUNITY)
Admission: EM | Admit: 2014-10-24 | Discharge: 2014-10-24 | Disposition: A | Discharge status: Home / Self Care | Payer: Self-pay | Attending: Emergency Medicine | Admitting: Emergency Medicine

## 2014-10-24 DIAGNOSIS — Z720 Tobacco use: Secondary | ICD-10-CM | POA: Insufficient documentation

## 2014-10-24 DIAGNOSIS — R51 Headache: Secondary | ICD-10-CM | POA: Insufficient documentation

## 2014-10-24 DIAGNOSIS — K088 Other specified disorders of teeth and supporting structures: Secondary | ICD-10-CM | POA: Insufficient documentation

## 2014-10-24 DIAGNOSIS — K0889 Other specified disorders of teeth and supporting structures: Secondary | ICD-10-CM

## 2014-10-24 DIAGNOSIS — K029 Dental caries, unspecified: Secondary | ICD-10-CM | POA: Insufficient documentation

## 2014-10-24 MED ORDER — PROMETHAZINE HCL 12.5 MG PO TABS
12.5000 mg | ORAL_TABLET | Freq: Once | ORAL | Status: AC
  Administered 2014-10-24: 12.5 mg via ORAL
  Filled 2014-10-24: qty 1

## 2014-10-24 MED ORDER — AMOXICILLIN 250 MG PO CAPS
500.0000 mg | ORAL_CAPSULE | Freq: Once | ORAL | Status: AC
  Administered 2014-10-24: 500 mg via ORAL
  Filled 2014-10-24: qty 2

## 2014-10-24 MED ORDER — ACETAMINOPHEN-CODEINE #3 300-30 MG PO TABS
2.0000 | ORAL_TABLET | Freq: Once | ORAL | Status: AC
  Administered 2014-10-24: 2 via ORAL
  Filled 2014-10-24: qty 2

## 2014-10-24 MED ORDER — IBUPROFEN 800 MG PO TABS: 800.0000 mg | ORAL_TABLET | Freq: Three times a day (TID) | ORAL | Status: DC

## 2014-10-24 MED ORDER — IBUPROFEN 800 MG PO TABS
800.0000 mg | ORAL_TABLET | Freq: Once | ORAL | Status: AC
  Administered 2014-10-24: 800 mg via ORAL
  Filled 2014-10-24: qty 1

## 2014-10-24 MED ORDER — AMOXICILLIN 500 MG PO CAPS: 500.0000 mg | ORAL_CAPSULE | Freq: Three times a day (TID) | ORAL | Status: DC

## 2014-10-24 MED ORDER — ACETAMINOPHEN-CODEINE #3 300-30 MG PO TABS: 1.0000 | ORAL_TABLET | Freq: Four times a day (QID) | ORAL | Status: DC | PRN

## 2014-10-24 NOTE — ED Notes (Signed)
Patient c/o left sided upper and lower tooth pain since Saturday.

## 2014-10-24 NOTE — ED Provider Notes (Signed)
CSN: [id number]     Arrival date & time 10/24/14  1922 History   First MD Initiated Contact with Patient 10/24/14 2024     Chief Complaint  Patient presents with  . Dental Pain     (Consider location/radiation/quality/duration/timing/severity/associated sxs/prior Treatment) Patient is a 24 y.o. male presenting with tooth pain. The history is provided by the patient.  Dental Pain Location:  Upper and lower Quality:  Throbbing Severity:  Severe Onset quality:  Sudden Duration:  4 days Timing:  Intermittent Progression:  Worsening Chronicity:  Chronic Context: dental caries and poor dentition   Relieved by:  Nothing Worsened by:  Cold food/drink Ineffective treatments:  Acetaminophen and NSAIDs Associated symptoms: headaches   Associated symptoms: no drooling, no fever and no neck pain   Risk factors: periodontal disease and smoking   Risk factors: no immunosuppression     History reviewed. No pertinent past medical history. History reviewed. No pertinent past surgical history. No family history on file. History  Substance Use Topics  . Smoking status: Current Every Day Smoker    Types: Cigarettes  . Smokeless tobacco: Not on file  . Alcohol Use: No    Review of Systems  Constitutional: Negative for fever and activity change.       All ROS Neg except as noted in HPI  HENT: Positive for dental problem. Negative for drooling.   Eyes: Negative for photophobia and discharge.  Respiratory: Negative for cough, shortness of breath and wheezing.   Cardiovascular: Negative for chest pain and palpitations.  Gastrointestinal: Negative for abdominal pain and blood in stool.  Genitourinary: Negative for dysuria, frequency and hematuria.  Musculoskeletal: Negative for back pain, arthralgias and neck pain.  Skin: Negative.   Neurological: Positive for headaches. Negative for dizziness, seizures and speech difficulty.  Psychiatric/Behavioral: Negative for hallucinations and  confusion.      Allergies  Review of patient's allergies indicates no known allergies.  Home Medications   Prior to Admission medications   Medication Sig Start Date End Date Taking? Authorizing Provider  amoxicillin-clavulanate (AUGMENTIN) 875-125 MG per tablet Take 1 tablet by mouth 2 (two) times daily. One po bid x 7 days 10/25/13   Vickki HearingStanley E Harrison, MD  HYDROcodone-acetaminophen [facility](NORCO) 5-325 MG per tablet Take 1 tablet by mouth every 6 (six) hours as needed for moderate pain. 01/02/14   Vickki HearingStanley E Harrison, MD  oxyCODONE-acetaminophen (PERCOCET) 5-325 MG per tablet Take 1 tablet by mouth every 4 (four) hours as needed. 11/02/13   Vickki HearingStanley E Harrison, MD   BP 129/82 mmHg  Pulse 95  Temp(Src) 98.8 F (37.1 C)  Resp 20  Ht 6' (1.829 m)  Wt 150 lb (68.04 kg)  BMI 20.34 kg/m2  SpO2 96% Physical Exam  Constitutional: He is oriented to person, place, and time. He appears well-developed and well-nourished.  Non-toxic appearance.  HENT:  Head: Normocephalic.  Right Ear: Tympanic membrane and external ear normal.  Left Ear: Tympanic membrane and external ear normal.  Multiple dental caries of the upper and lower jaw area. Patient has the rupturing of upper and lower wisdom teeth. No visible abscess. No swelling under the tongue. Advancing gum disease noted.  Eyes: EOM and lids are normal. Pupils are equal, round, and reactive to light.  Neck: Normal range of motion. Neck supple. Carotid bruit is not present.  Cardiovascular: Normal rate, regular rhythm, normal heart sounds, intact distal pulses and normal pulses.   Pulmonary/Chest: Breath sounds normal. No respiratory distress.  Abdominal: Soft. Bowel sounds  are normal. There is no tenderness. There is no guarding.  Musculoskeletal: Normal range of motion.  Lymphadenopathy:       Head (right side): No submandibular adenopathy present.       Head (left side): No submandibular adenopathy present.    He has no cervical adenopathy.   Neurological: He is alert and oriented to person, place, and time. He has normal strength. No cranial nerve deficit or sensory deficit.  Skin: Skin is warm and dry.  Psychiatric: He has a normal mood and affect. His speech is normal.  Nursing note and vitals reviewed.   ED Course  Procedures (including critical care time) Labs Review Labs Reviewed - No data to display  Imaging Review No results found.   EKG Interpretation None      MDM  No high fever. No swelling under the tongue, or signs of Ludwig's angina. Airway patent.  Rx for tylenol codeine, amoxil and ibuprofen given to the patient Dental resources given to the patient.   Final diagnoses:  Pain, dental    *I have reviewed nursing notes, vital signs, and all appropriate lab and imaging results for this patient.[address], PA-C 10/25/14 1646  Vida Roller, MD 10/26/14 [phone]

## 2014-10-24 NOTE — Discharge Instructions (Signed)
Please use ibuprofen, and Amoxil 3 times daily with food. Use Tylenol codeine every 6 hours if needed for severe pain. It is important that you see a dentist as soon as possible. Dental Pain Toothache is pain in or around a tooth. It may get worse with chewing or with cold or heat.  HOME CARE  Your dentist may use a numbing medicine during treatment. If so, you may need to avoid eating until the medicine wears off. Ask your dentist about this.  Only take medicine as told by your dentist or doctor.  Avoid chewing food near the painful tooth until after all treatment is done. Ask your dentist about this. GET HELP RIGHT AWAY IF:   The problem gets worse or new problems appear.  You have a fever.  There is redness and puffiness (swelling) of the face, jaw, or neck.  You cannot open your mouth.  There is pain in the jaw.  There is very bad pain that is not helped by medicine. MAKE SURE YOU:   Understand these instructions.  Will watch your condition.  Will get help right away if you are not doing well or get worse. Document Released: 03/16/2008 Document Revised: 12/21/2011 Document Reviewed: 03/16/2008 [facility] Patient Information 2015 [place], [place]. This information is not intended to replace advice given to you by your health care provider. Make sure you discuss any questions you have with your health care provider.

## 2015-02-14 IMAGING — CR DG HAND 2V*L*
2 series · 2 of 2 positions shown · non-contrast
Comparison: None.

CLINICAL DATA: Finger injury.

EXAM:
LEFT HAND - 2 VIEW

[view not recorded (1 of 2)]
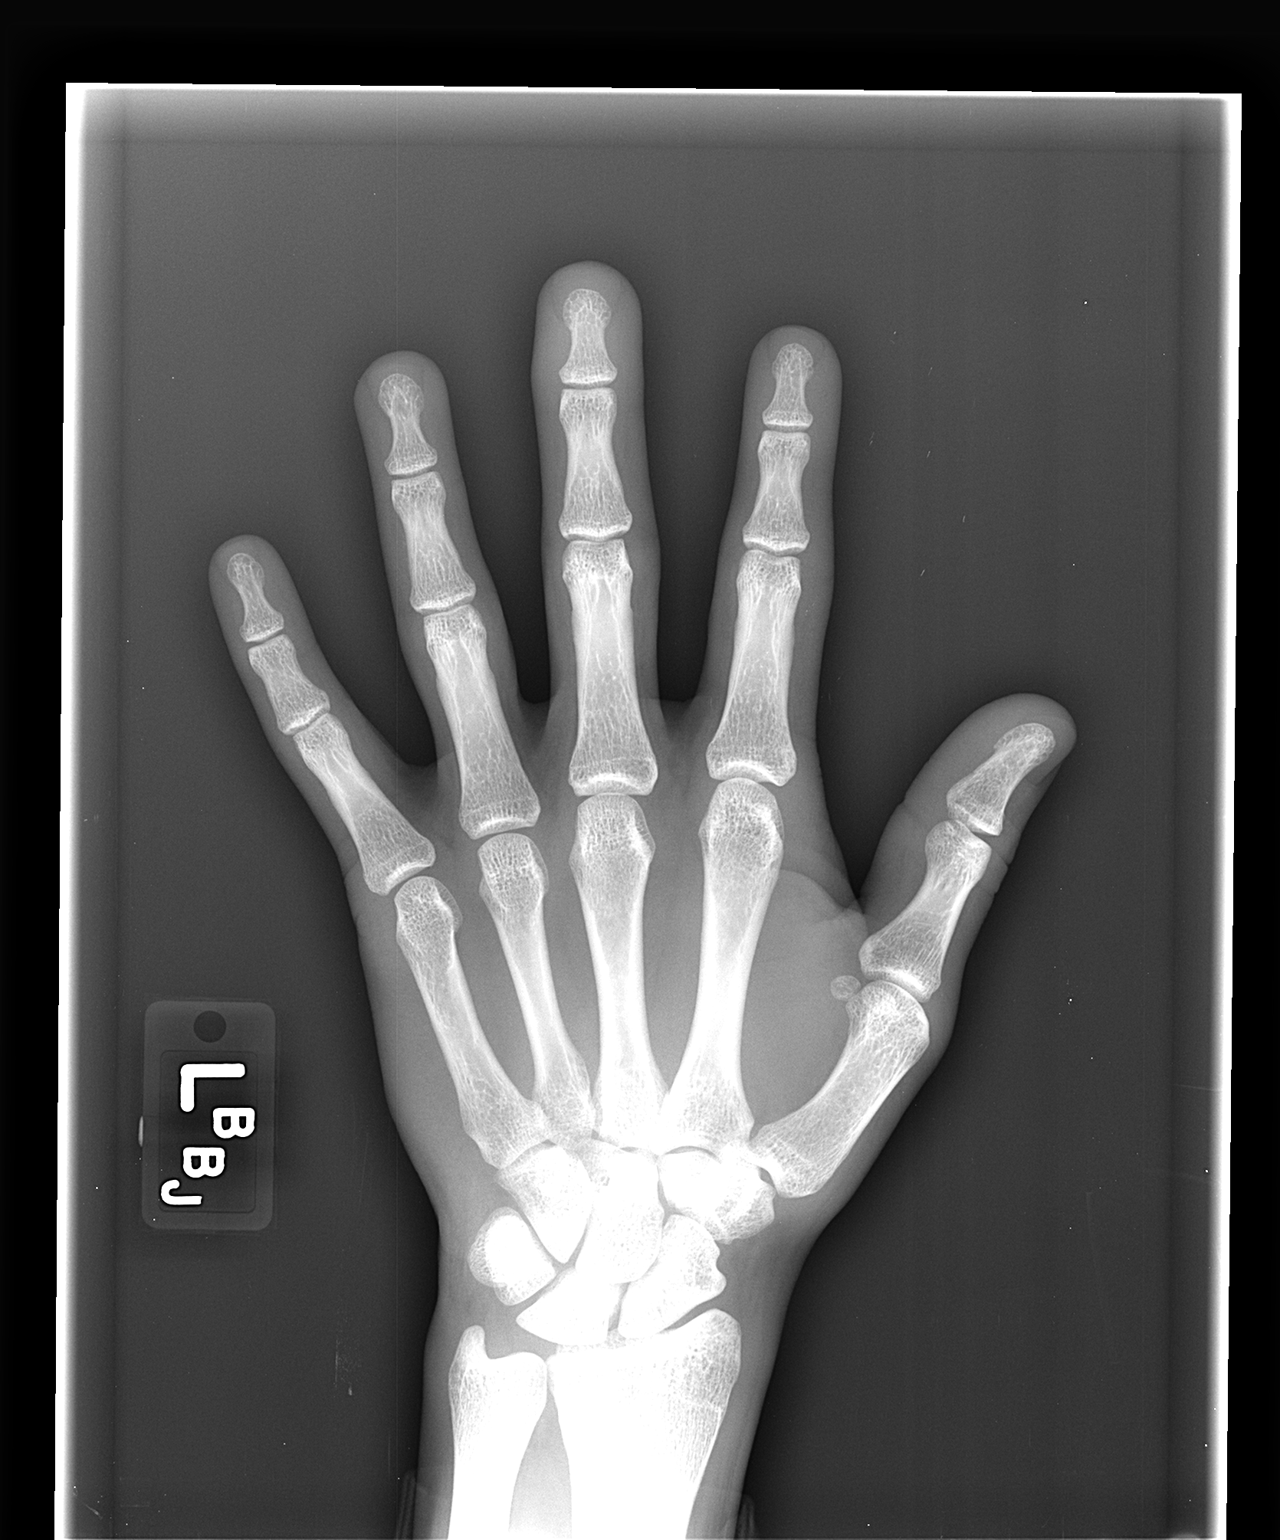

[view not recorded (2 of 2)]
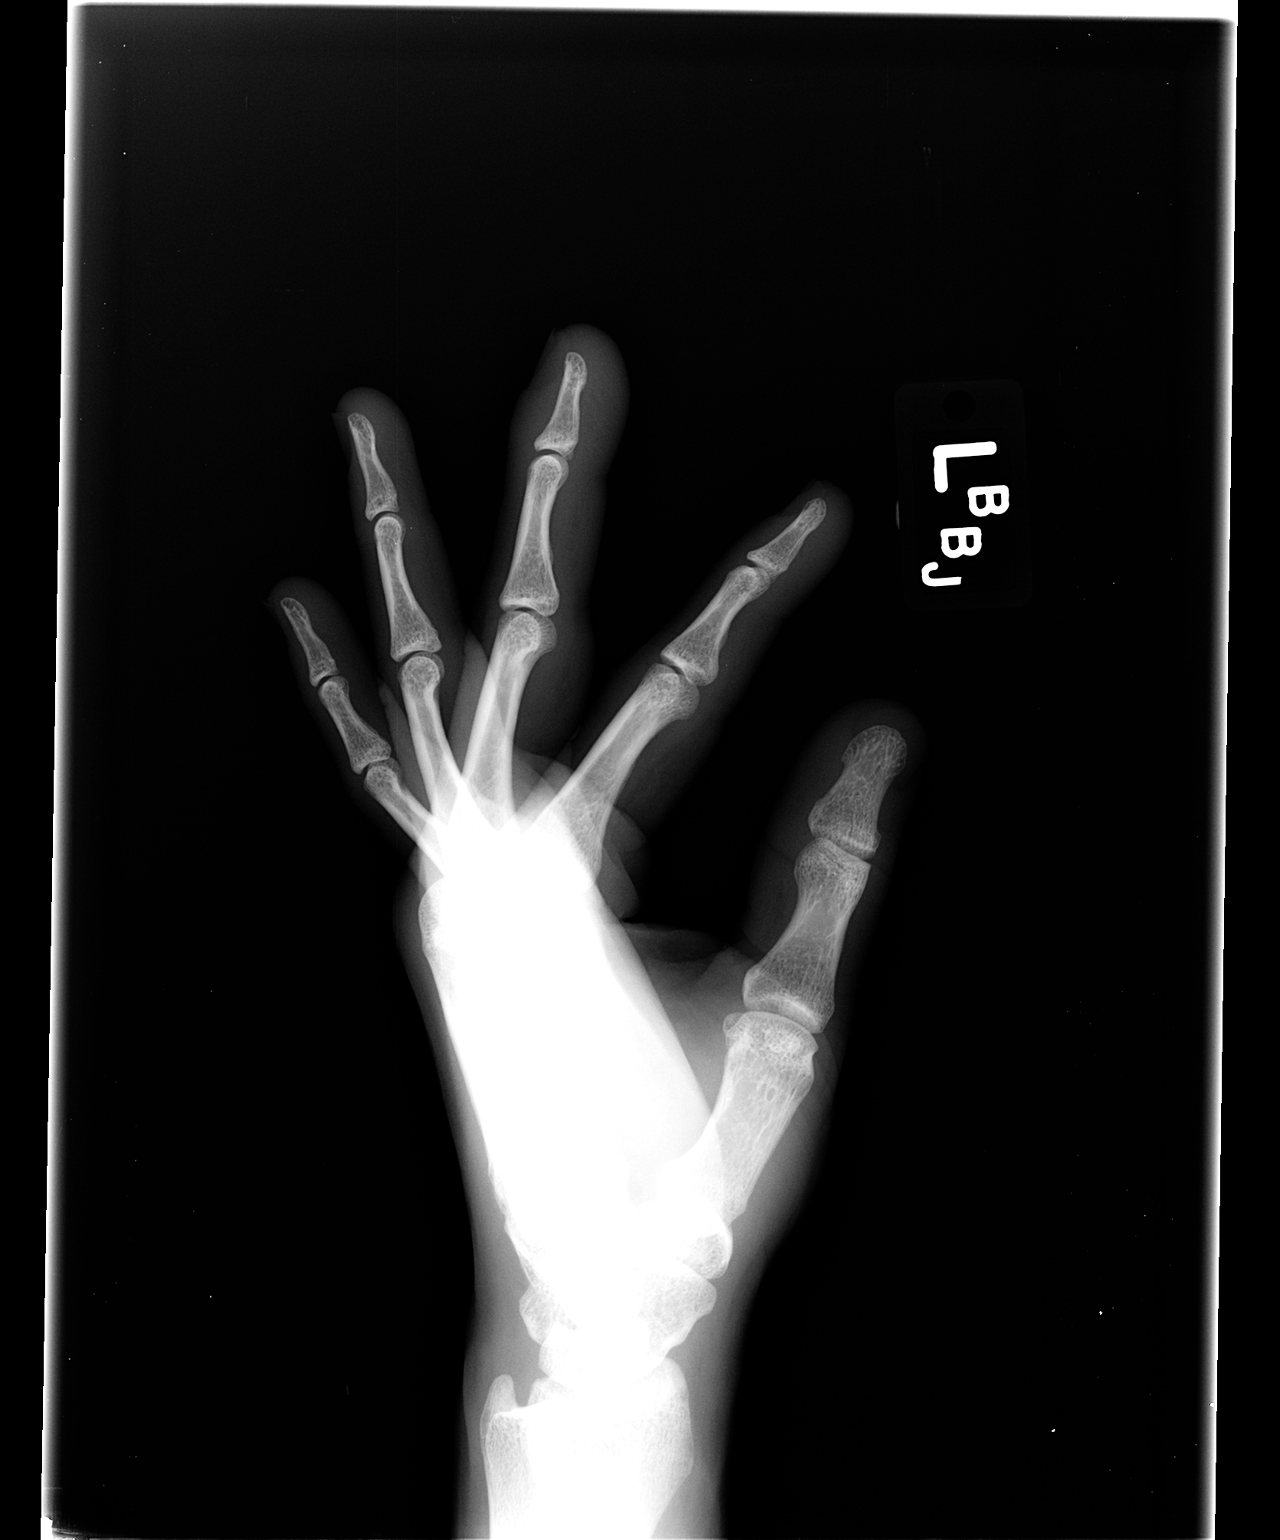

[2 of 2 positions shown; findings below may reference images not displayed]

FINDINGS: There is soft tissue swelling of the long finger pad. No underlying
fracture or visible infection. No radiodense foreign body.
IMPRESSION: No osseous abnormality. Middle finger soft tissue swelling distally.

## 2021-01-28 ENCOUNTER — Encounter (HOSPITAL_COMMUNITY): Payer: Self-pay | Admitting: *Deleted

## 2021-01-28 ENCOUNTER — Emergency Department (HOSPITAL_COMMUNITY)
Admission: EM | Admit: 2021-01-28 | Discharge: 2021-01-28 | Disposition: A | Discharge status: Home / Self Care | Payer: Medicaid - Out of State | Attending: Emergency Medicine | Admitting: Emergency Medicine

## 2021-01-28 ENCOUNTER — Emergency Department (HOSPITAL_COMMUNITY): Payer: Medicaid - Out of State

## 2021-01-28 DIAGNOSIS — F17210 Nicotine dependence, cigarettes, uncomplicated: Secondary | ICD-10-CM | POA: Insufficient documentation

## 2021-01-28 DIAGNOSIS — M5441 Lumbago with sciatica, right side: Secondary | ICD-10-CM | POA: Diagnosis present

## 2021-01-28 LAB — URINALYSIS, ROUTINE W REFLEX MICROSCOPIC
Bilirubin Urine: NEGATIVE
Glucose, UA: NEGATIVE mg/dL
Hgb urine dipstick: NEGATIVE
Ketones, ur: NEGATIVE mg/dL
Leukocytes,Ua: NEGATIVE
Nitrite: NEGATIVE
Protein, ur: NEGATIVE mg/dL
Specific Gravity, Urine: 1.02 (ref 1.005–1.030)
pH: 6 (ref 5.0–8.0)

## 2021-01-28 MED ORDER — PREDNISONE 10 MG (21) PO TBPK: ORAL_TABLET | Freq: Every day | ORAL | 0 refills | Status: DC

## 2021-01-28 MED ORDER — METHOCARBAMOL 500 MG PO TABS: 500.0000 mg | ORAL_TABLET | Freq: Two times a day (BID) | ORAL | 0 refills | Status: DC

## 2021-01-28 MED ORDER — KETOROLAC TROMETHAMINE 30 MG/ML IJ SOLN
60.0000 mg | Freq: Once | INTRAMUSCULAR | Status: AC
  Administered 2021-01-28: 60 mg via INTRAMUSCULAR
  Filled 2021-01-28: qty 2

## 2021-01-28 NOTE — ED Triage Notes (Signed)
Back pain for the past 3 days getting worse, has a history of same

## 2021-01-28 NOTE — Discharge Instructions (Signed)
Please pick up medications and take as prescribed. DO NOT DRIVE WHILE ON THE MUSCLE RELAXER AS IT CAN MAKE YOU DROWSY. I would recommend taking them at nighttime to help you sleep.   Please follow up with [facility] for primary care needs/follow up purposes.   Return to the ED for any worsening symptoms

## 2021-01-28 NOTE — ED Provider Notes (Signed)
Right lower [facility] EMERGENCY DEPARTMENT Provider Note   CSN: [id number] Arrival date & time: 01/28/21  1548     History Chief Complaint  Patient presents with  . Back Pain    Glenn Green is a 30 y.o. male who presents to the ED today with complaint of gradual onset, constant, worsening, back pain with shooting pain down bilateral lower extremities (right greater than left) the past 3 days.  Patient reports that he was working in his attic over the weekend.  On his way down he felt a twinge in his back and has been worsening since then.  He describes a sharp shooting pain in his lower extremities.  He has been taking ibuprofen and Tylenol home without any relief.  Patient also reports he feels like he is urinating more than normal which feels like started around the same time he started having back pain.  He reports history of intermittent lower back pain over the years.  He states that he has been seen in [place] for same without any explanation of his symptoms.  Patient denies any fevers, chills, urinary retention, urinary or bowel incontinence, saddle anesthesia, weakness/numbness, or any other associated symptoms. No previous spinal surgeries. No recent spinal manipulation. No hx prolonged steroid use. No hx IVDU.   The history is provided by the patient and medical records.       History reviewed. No pertinent past medical history.  Patient Active Problem List   Diagnosis Date Noted  . Wound infection 11/02/2013  . Finger injury 10/25/2013  . Felon 10/25/2013  . Paronychia 10/25/2013    History reviewed. No pertinent surgical history.     No family history on file.  Social History   Tobacco Use  . Smoking status: Current Every Day Smoker    Types: Cigarettes  . Smokeless tobacco: Never Used  Substance Use Topics  . Alcohol use: No  . Drug use: No    Home Medications Prior to Admission medications   Medication Sig Start Date End Date Taking?  Authorizing Provider  methocarbamol (ROBAXIN) 500 MG tablet Take 1 tablet (500 mg total) by mouth 2 (two) times daily. 01/28/21  Yes Marshall Roehrich, PA-C  predniSONE (STERAPRED UNI-PAK 21 TAB) 10 MG (21) TBPK tablet Take by mouth daily. Take 6 tabs by mouth daily  for 2 days, then 5 tabs for 2 days, then 4 tabs for 2 days, then 3 tabs for 2 days, 2 tabs for 2 days, then 1 tab by mouth daily for 2 days 01/28/21  Yes Flint Hakeem, PA-C  acetaminophen-codeine (TYLENOL #3) 300-30 MG per tablet Take 1-2 tablets by mouth every 6 (six) hours as needed. 10/24/14   Ivery Quale, PA-C  amoxicillin (AMOXIL) 500 MG capsule Take 1 capsule (500 mg total) by mouth 3 (three) times daily. 10/24/14   Ivery Quale, PA-C  HYDROcodone-acetaminophen (NORCO) 5-325 MG per tablet Take 1 tablet by mouth every 6 (six) hours as needed for moderate pain. 01/02/14   Vickki Hearing, MD  ibuprofen (ADVIL,MOTRIN) 800 MG tablet Take 1 tablet (800 mg total) by mouth 3 (three) times daily. 10/24/14   Ivery Quale, PA-C  oxyCODONE-acetaminophen (PERCOCET) 5-325 MG per tablet Take 1 tablet by mouth every 4 (four) hours as needed. 11/02/13   Vickki Hearing, MD    Allergies    Patient has no known allergies.  Review of Systems   Review of Systems  Constitutional: Negative for chills and fever.  Genitourinary: Positive for frequency. Negative  for dysuria.  Musculoskeletal: Positive for arthralgias and back pain.  Neurological: Negative for weakness and numbness.  All other systems reviewed and are negative.   Physical Exam Updated Vital Signs BP 110/74   Pulse 93   Temp 97.6 F (36.4 C) (Oral)   Resp 20   SpO2 99%   Physical Exam Vitals and nursing note reviewed.  Constitutional:      Appearance: He is diaphoretic. He is not ill-appearing.  HENT:     Head: Normocephalic and atraumatic.  Eyes:     Conjunctiva/sclera: Conjunctivae normal.  Cardiovascular:     Rate and Rhythm: Normal rate and regular  rhythm.     Pulses: Normal pulses.  Pulmonary:     Effort: Pulmonary effort is normal.     Breath sounds: Normal breath sounds. No wheezing, rhonchi or rales.  Abdominal:     Palpations: Abdomen is soft.     Tenderness: There is no abdominal tenderness.  Musculoskeletal:     Cervical back: Neck supple.     Comments: No C or T midline spinal TTP. + Lumbar midline spinal TTP with associated right paralumbar musculature TTP. Strength decreased throughout RLE s/2 pain. Strength 5/5 to LLE. Sensation intact. 2+ DP pulses bilaterally.   Skin:    General: Skin is warm.  Neurological:     Mental Status: He is alert.     ED Results / Procedures / Treatments   Labs (all labs ordered are listed, but only abnormal results are displayed) Labs Reviewed  URINALYSIS, ROUTINE W REFLEX MICROSCOPIC    EKG None  Radiology DG Lumbar Spine Complete  Result Date: 01/28/2021 CLINICAL DATA:  Chronic low back pain. EXAM: LUMBAR SPINE - COMPLETE 4+ VIEW COMPARISON:  None. FINDINGS: Five lumbar type vertebral bodies. No acute fracture or subluxation. Vertebral body heights are preserved. Alignment is normal. Intervertebral disc spaces are maintained. Normal sacroiliac joints. IMPRESSION: Negative. Electronically Signed   By: Obie Dredge M.D.   On: 01/28/2021 17:09    Procedures Procedures   Medications Ordered in ED Medications  ketorolac (TORADOL) 30 MG/ML injection 60 mg (60 mg Intramuscular Given 01/28/21 1658)    ED Course  I have reviewed the triage vital signs and the nursing notes.  Pertinent labs & imaging results that were available during my care of the patient were reviewed by me and considered in my medical decision making (see chart for details).    MDM Rules/Calculators/A&P                          30 year old male with a history of intermittent chronic back pain who presents to the ED today with complaint of sudden onset lower back pain radiating down both legs for the past 3  days after stepping down from the attic.  Also complaining of urinary frequency.  On arrival to the ED today patient is afebrile, nontachycardic and nontachypneic.  When he is brought back to the room he is in a wheelchair and appears uncomfortable.  He is diaphoretic.  He does have some lower midline spinal tenderness palpation however does have right-sided paralumbar musculature tenderness palpation as well.  He describes a stabbing sensation down his bilateral lower extremities.  He strength is limited on the right side which I suspect is secondary to pain.  He is neurovascularly intact throughout.  Given midline spinal tenderness will obtain an x-ray at this time.  Will provide Toradol for pain medication.  We will also  check urinalysis given complaint of urinary frequency however symptoms seem more musculoskeletal in nature versus renal.  He has no red flag symptoms today concerning for cauda equina, spinal epidural abscess, AAA.  Urinalysis negative for infection.  No hemoglobin to suggest kidney stone. X-ray negative for any acute findings. Discharge home at this time with steroid course as well as muscle relaxer.  Given sharp shooting pain I suspect radicular type pain secondary to possible sciatica.  Will have patient follow with PCP regarding ED visit today.  Instructed to return for any worsening symptoms.  This note was prepared using Dragon voice recognition software and may include unintentional dictation errors due to the inherent limitations of voice recognition software.  Final Clinical Impression(s) / ED Diagnoses Final diagnoses:  Acute right-sided low back pain with right-sided sciatica    Rx / DC Orders ED Discharge Orders         Ordered    predniSONE (STERAPRED UNI-PAK 21 TAB) 10 MG (21) TBPK tablet  Daily        01/28/21 1808    methocarbamol (ROBAXIN) 500 MG tablet  2 times daily        01/28/21 1808           Discharge Instructions     Please pick up medications  and take as prescribed. DO NOT DRIVE WHILE ON THE MUSCLE RELAXER AS IT CAN MAKE YOU DROWSY. I would recommend taking them at nighttime to help you sleep.   Please follow up with [facility] for primary care needs/follow up purposes.   Return to the ED for any worsening symptoms       Tanda Rockers, Cordelia Poche 01/28/21 Mallie Snooks    Eber Hong, MD 01/31/21 [phone]

## 2022-05-28 ENCOUNTER — Emergency Department (HOSPITAL_COMMUNITY)
Admission: EM | Admit: 2022-05-28 | Discharge: 2022-05-28 | Disposition: A | Discharge status: Home / Self Care | Payer: Medicaid Other | Attending: Emergency Medicine | Admitting: Emergency Medicine

## 2022-05-28 ENCOUNTER — Other Ambulatory Visit: Payer: Self-pay

## 2022-05-28 ENCOUNTER — Encounter (HOSPITAL_COMMUNITY): Payer: Self-pay | Admitting: Emergency Medicine

## 2022-05-28 ENCOUNTER — Emergency Department (HOSPITAL_COMMUNITY): Payer: Medicaid Other

## 2022-05-28 DIAGNOSIS — Z20822 Contact with and (suspected) exposure to covid-19: Secondary | ICD-10-CM | POA: Diagnosis not present

## 2022-05-28 DIAGNOSIS — R519 Headache, unspecified: Secondary | ICD-10-CM

## 2022-05-28 DIAGNOSIS — J40 Bronchitis, not specified as acute or chronic: Secondary | ICD-10-CM | POA: Diagnosis not present

## 2022-05-28 DIAGNOSIS — R059 Cough, unspecified: Secondary | ICD-10-CM | POA: Diagnosis present

## 2022-05-28 LAB — CBC WITH DIFFERENTIAL/PLATELET
Abs Immature Granulocytes: 0.03 10*3/uL (ref 0.00–0.07)
Basophils Absolute: 0 10*3/uL (ref 0.0–0.1)
Basophils Relative: 0 %
Eosinophils Absolute: 0.4 10*3/uL (ref 0.0–0.5)
Eosinophils Relative: 4 %
HCT: 37.7 % — ABNORMAL LOW (ref 39.0–52.0)
Hemoglobin: 13.1 g/dL (ref 13.0–17.0)
Immature Granulocytes: 0 %
Lymphocytes Relative: 23 %
Lymphs Abs: 2 10*3/uL (ref 0.7–4.0)
MCH: 31.9 pg (ref 26.0–34.0)
MCHC: 34.7 g/dL (ref 30.0–36.0)
MCV: 91.7 fL (ref 80.0–100.0)
Monocytes Absolute: 0.6 10*3/uL (ref 0.1–1.0)
Monocytes Relative: 6 %
Neutro Abs: 5.9 10*3/uL (ref 1.7–7.7)
Neutrophils Relative %: 67 %
Platelets: 328 10*3/uL (ref 150–400)
RBC: 4.11 MIL/uL — ABNORMAL LOW (ref 4.22–5.81)
RDW: 12.7 % (ref 11.5–15.5)
WBC: 8.9 10*3/uL (ref 4.0–10.5)
nRBC: 0 % (ref 0.0–0.2)

## 2022-05-28 LAB — BASIC METABOLIC PANEL
Anion gap: 6 (ref 5–15)
BUN: 8 mg/dL (ref 6–20)
CO2: 24 mmol/L (ref 22–32)
Calcium: 8.8 mg/dL — ABNORMAL LOW (ref 8.9–10.3)
Chloride: 109 mmol/L (ref 98–111)
Creatinine, Ser: 1.25 mg/dL — ABNORMAL HIGH (ref 0.61–1.24)
GFR, Estimated: >60 mL/min (ref >60)
Glucose, Bld: 103 mg/dL — ABNORMAL HIGH (ref 70–99)
Potassium: 3.8 mmol/L (ref 3.5–5.1)
Sodium: 139 mmol/L (ref 135–145)

## 2022-05-28 LAB — SARS CORONAVIRUS 2 BY RT PCR: SARS Coronavirus 2 by RT PCR: NEGATIVE

## 2022-05-28 MED ORDER — KETOROLAC TROMETHAMINE 30 MG/ML IJ SOLN
30.0000 mg | Freq: Once | INTRAMUSCULAR | Status: AC
  Administered 2022-05-28: 30 mg via INTRAVENOUS
  Filled 2022-05-28: qty 1

## 2022-05-28 MED ORDER — DIPHENHYDRAMINE HCL 50 MG/ML IJ SOLN
50.0000 mg | Freq: Once | INTRAMUSCULAR | Status: AC
  Administered 2022-05-28: 50 mg via INTRAVENOUS
  Filled 2022-05-28: qty 1

## 2022-05-28 MED ORDER — DEXAMETHASONE SODIUM PHOSPHATE 10 MG/ML IJ SOLN
10.0000 mg | Freq: Once | INTRAMUSCULAR | Status: AC
  Administered 2022-05-28: 10 mg via INTRAVENOUS
  Filled 2022-05-28: qty 1

## 2022-05-28 MED ORDER — PROCHLORPERAZINE EDISYLATE 10 MG/2ML IJ SOLN
10.0000 mg | Freq: Once | INTRAMUSCULAR | Status: AC
  Administered 2022-05-28: 10 mg via INTRAVENOUS
  Filled 2022-05-28: qty 2

## 2022-05-28 MED ORDER — ALBUTEROL SULFATE HFA 108 (90 BASE) MCG/ACT IN AERS: 2.0000 | INHALATION_SPRAY | RESPIRATORY_TRACT | 2 refills | Status: AC | PRN

## 2022-05-28 MED ORDER — SODIUM CHLORIDE 0.9 % IV BOLUS
1000.0000 mL | Freq: Once | INTRAVENOUS | Status: AC
  Administered 2022-05-28: 1000 mL via INTRAVENOUS

## 2022-05-28 MED ORDER — PREDNISONE 20 MG PO TABS: 40.0000 mg | ORAL_TABLET | Freq: Every day | ORAL | 0 refills | Status: AC

## 2022-05-28 MED ORDER — AMOXICILLIN 500 MG PO CAPS: 1000.0000 mg | ORAL_CAPSULE | Freq: Two times a day (BID) | ORAL | 0 refills | Status: AC

## 2022-05-28 NOTE — ED Notes (Signed)
Pt ambulated to the bathroom.  

## 2022-05-28 NOTE — ED Provider Notes (Signed)
[facility] EMERGENCY DEPARTMENT Provider Note   CSN: [id number] Arrival date & time: 05/28/22  0505     History  Chief Complaint  Patient presents with   Headache    With chills and diaphoresis     Glenn Green is a 31 y.o. male.  Patient presents to the emergency department for evaluation of flulike illness for the last 5 days.  Patient has been experiencing cough, bringing up thick and sometimes blood-tinged mucus.  Over the last several days he has had a progressively worsening headache.  He reports that it started as a slight headache and gradually has worsened.  He does report a history of migraines.  Patient with light sensitivity and nausea, no vomiting.       Home Medications Prior to Admission medications   Medication Sig Start Date End Date Taking? Authorizing Provider  albuterol (VENTOLIN HFA) 108 (90 Base) MCG/ACT inhaler Inhale 2 puffs into the lungs every 4 (four) hours as needed for wheezing or shortness of breath. 05/28/22  Yes Riona Lahti, Gwenyth Allegra, MD  amoxicillin (AMOXIL) 500 MG capsule Take 2 capsules (1,000 mg total) by mouth 2 (two) times daily. 05/28/22  Yes Asuncion Tapscott, Gwenyth Allegra, MD  predniSONE (DELTASONE) 20 MG tablet Take 2 tablets (40 mg total) by mouth daily with breakfast. 05/28/22  Yes Alara Daniel, Gwenyth Allegra, MD      Allergies    Mushroom extract complex and Bee venom    Review of Systems   Review of Systems  Physical Exam Updated Vital Signs BP 112/66 (BP Location: Left Arm)   Pulse (!) 55   Temp 98.1 F (36.7 C) (Oral)   Resp 12   Ht 6' (1.829 m)   Wt 78.5 kg   SpO2 99%   BMI 23.46 kg/m  Physical Exam Vitals and nursing note reviewed.  Constitutional:      General: He is not in acute distress.    Appearance: He is well-developed.  HENT:     Head: Normocephalic and atraumatic.     Mouth/Throat:     Mouth: Mucous membranes are moist.  Eyes:     General: Vision grossly intact. Gaze aligned appropriately.     Extraocular  Movements: Extraocular movements intact.     Conjunctiva/sclera: Conjunctivae normal.  Cardiovascular:     Rate and Rhythm: Normal rate and regular rhythm.     Pulses: Normal pulses.     Heart sounds: Normal heart sounds, S1 normal and S2 normal. No murmur heard.    No friction rub. No gallop.  Pulmonary:     Effort: Pulmonary effort is normal. No respiratory distress.     Breath sounds: Normal breath sounds.  Abdominal:     Palpations: Abdomen is soft.     Tenderness: There is no abdominal tenderness. There is no guarding or rebound.     Hernia: No hernia is present.  Musculoskeletal:        General: No swelling.     Cervical back: Full passive range of motion without pain, normal range of motion and neck supple. No pain with movement, spinous process tenderness or muscular tenderness. Normal range of motion.     Right lower leg: No edema.     Left lower leg: No edema.  Skin:    General: Skin is warm and dry.     Capillary Refill: Capillary refill takes less than 2 seconds.     Findings: No ecchymosis, erythema, lesion or wound.  Neurological:     Mental Status:  He is alert and oriented to person, place, and time.     GCS: GCS eye subscore is 4. GCS verbal subscore is 5. GCS motor subscore is 6.     Cranial Nerves: Cranial nerves 2-12 are intact.     Sensory: Sensation is intact.     Motor: Motor function is intact. No weakness or abnormal muscle tone.     Coordination: Coordination is intact.  Psychiatric:        Mood and Affect: Mood normal.        Speech: Speech normal.        Behavior: Behavior normal.     ED Results / Procedures / Treatments   Labs (all labs ordered are listed, but only abnormal results are displayed) Labs Reviewed  CBC WITH DIFFERENTIAL/PLATELET - Abnormal; Notable for the following components:      Result Value   RBC 4.11 (*)    HCT 37.7 (*)    All other components within normal limits  BASIC METABOLIC PANEL - Abnormal; Notable for the following  components:   Glucose, Bld 103 (*)    Creatinine, Ser 1.25 (*)    Calcium 8.8 (*)    All other components within normal limits  SARS CORONAVIRUS 2 BY RT PCR    EKG None  Radiology DG Chest 2 View  Result Date: 05/28/2022 CLINICAL DATA:  Cough. EXAM: CHEST - 2 VIEW COMPARISON:  None Available. FINDINGS: The lungs are clear without focal pneumonia, edema, pneumothorax or pleural effusion. Interstitial markings are diffusely coarsened with chronic features. The cardiopericardial silhouette is within normal limits for size. The visualized bony structures of the thorax are unremarkable. Gaseous distention of colon noted left upper quadrant. IMPRESSION: Chronic interstitial coarsening . No active cardiopulmonary disease. Electronically Signed   By: Kennith Center M.D.   On: 05/28/2022 06:57    Procedures Procedures    Medications Ordered in ED Medications  sodium chloride 0.9 % bolus 1,000 mL (1,000 mLs Intravenous New Bag/Given 05/28/22 0658)  ketorolac (TORADOL) 30 MG/ML injection 30 mg (30 mg Intravenous Given 05/28/22 0624)  prochlorperazine (COMPAZINE) injection 10 mg (10 mg Intravenous Given 05/28/22 7124)  diphenhydrAMINE (BENADRYL) injection 50 mg (50 mg Intravenous Given 05/28/22 0622)  dexamethasone (DECADRON) injection 10 mg (10 mg Intravenous Given 05/28/22 5809)    ED Course/ Medical Decision Making/ A&P                           Medical Decision Making Amount and/or Complexity of Data Reviewed Labs: ordered. Radiology: ordered.  Risk Prescription drug management.   Patient does not appear toxic.  His vital signs are normal.  He is afebrile.  Neurologic exam is normal.  He does have a headache but it was gradual in onset over a period of days.  He has had similar headaches in the past.  No imaging is necessary.  No evidence of meningismus.  Treated as a migraine which she has been diagnosed with in the past.  Basic labs unremarkable.  He has had cough and congestion  including sputum production.  Chest x-ray does not show pneumonia.  Likely viral etiology, treat as bronchitis.  COVID testing negative.        Final Clinical Impression(s) / ED Diagnoses Final diagnoses:  Bad headache  Bronchitis    Rx / DC Orders ED Discharge Orders          Ordered    predniSONE (DELTASONE) 20 MG tablet  Daily with breakfast        05/28/22 0751    amoxicillin (AMOXIL) 500 MG capsule  2 times daily        05/28/22 0751    albuterol (VENTOLIN HFA) 108 (90 Base) MCG/ACT inhaler  Every 4 hours PRN        05/28/22 0751              Gilda Crease, MD 05/28/22 [phone]

## 2022-05-28 NOTE — ED Notes (Addendum)
Patient transported to X-ray, will start IV fluids upon return

## 2022-05-28 NOTE — ED Triage Notes (Signed)
C/o headache x 5 days, fever. Last week diagnosed with infection in left eye and did not take antibiotics for it due to inability to get prescription filled.  Starting Saturday, headache started and has progressively has gotten worse. Last took tylenol 500 mg at 0000. Constant nausea, decreased appetite. Denies V/D

## 2024-05-14 DIAGNOSIS — M7989 Other specified soft tissue disorders: Secondary | ICD-10-CM | POA: Diagnosis not present

## 2024-05-14 DIAGNOSIS — S80812A Abrasion, left lower leg, initial encounter: Secondary | ICD-10-CM | POA: Diagnosis not present

## 2024-05-14 DIAGNOSIS — M79605 Pain in left leg: Secondary | ICD-10-CM | POA: Diagnosis not present

## 2024-05-14 DIAGNOSIS — S8992XD Unspecified injury of left lower leg, subsequent encounter: Secondary | ICD-10-CM | POA: Diagnosis not present

## 2024-05-28 DIAGNOSIS — Z7951 Long term (current) use of inhaled steroids: Secondary | ICD-10-CM | POA: Diagnosis not present

## 2024-05-28 DIAGNOSIS — R059 Cough, unspecified: Secondary | ICD-10-CM | POA: Diagnosis not present

## 2024-05-28 DIAGNOSIS — J209 Acute bronchitis, unspecified: Secondary | ICD-10-CM | POA: Diagnosis not present

## 2024-05-28 DIAGNOSIS — R0602 Shortness of breath: Secondary | ICD-10-CM | POA: Diagnosis not present

## 2024-05-28 DIAGNOSIS — Z7952 Long term (current) use of systemic steroids: Secondary | ICD-10-CM | POA: Diagnosis not present

## 2024-05-28 DIAGNOSIS — R21 Rash and other nonspecific skin eruption: Secondary | ICD-10-CM | POA: Diagnosis not present

## 2024-05-28 DIAGNOSIS — F17210 Nicotine dependence, cigarettes, uncomplicated: Secondary | ICD-10-CM | POA: Diagnosis not present

## 2024-05-28 DIAGNOSIS — L309 Dermatitis, unspecified: Secondary | ICD-10-CM | POA: Diagnosis not present

## 2024-06-24 ENCOUNTER — Emergency Department (HOSPITAL_COMMUNITY)
Admission: EM | Admit: 2024-06-24 | Discharge: 2024-06-24 | Disposition: A | Discharge status: Home / Self Care | Attending: Emergency Medicine | Admitting: Emergency Medicine

## 2024-06-24 ENCOUNTER — Other Ambulatory Visit: Payer: Self-pay

## 2024-06-24 ENCOUNTER — Encounter (HOSPITAL_COMMUNITY): Payer: Self-pay

## 2024-06-24 DIAGNOSIS — M545 Low back pain, unspecified: Secondary | ICD-10-CM | POA: Insufficient documentation

## 2024-06-24 DIAGNOSIS — Z5321 Procedure and treatment not carried out due to patient leaving prior to being seen by health care provider: Secondary | ICD-10-CM | POA: Diagnosis not present

## 2024-06-24 DIAGNOSIS — Z7951 Long term (current) use of inhaled steroids: Secondary | ICD-10-CM | POA: Diagnosis not present

## 2024-06-24 DIAGNOSIS — M79605 Pain in left leg: Secondary | ICD-10-CM | POA: Diagnosis not present

## 2024-06-24 DIAGNOSIS — S39012A Strain of muscle, fascia and tendon of lower back, initial encounter: Secondary | ICD-10-CM | POA: Diagnosis not present

## 2024-06-24 DIAGNOSIS — X500XXA Overexertion from strenuous movement or load, initial encounter: Secondary | ICD-10-CM | POA: Diagnosis not present

## 2024-06-24 DIAGNOSIS — F17210 Nicotine dependence, cigarettes, uncomplicated: Secondary | ICD-10-CM | POA: Diagnosis not present

## 2024-06-24 DIAGNOSIS — X58XXXA Exposure to other specified factors, initial encounter: Secondary | ICD-10-CM | POA: Diagnosis not present

## 2024-06-24 NOTE — ED Triage Notes (Signed)
 Pt stated that he began having lower back pain this week. Pain radiated into left leg and foot.
